# Patient Record
Sex: Male | Born: 1990 | Race: Black or African American | Hispanic: No | Marital: Single | Smoking: Never smoker
Health system: Southern US, Community
[De-identification: ages and names within clinical notes are randomized; demographics above are authoritative.]

---

## 2013-11-07 ENCOUNTER — Emergency Department (HOSPITAL_COMMUNITY): Payer: BC Managed Care – PPO

## 2013-11-07 ENCOUNTER — Emergency Department (HOSPITAL_COMMUNITY)
Admission: EM | Admit: 2013-11-07 | Discharge: 2013-11-07 | Disposition: A | Discharge status: Home / Self Care | Payer: BC Managed Care – PPO | Attending: Emergency Medicine | Admitting: Emergency Medicine

## 2013-11-07 ENCOUNTER — Encounter (HOSPITAL_COMMUNITY): Payer: Self-pay | Admitting: Emergency Medicine

## 2013-11-07 DIAGNOSIS — J029 Acute pharyngitis, unspecified: Secondary | ICD-10-CM | POA: Insufficient documentation

## 2013-11-07 DIAGNOSIS — R110 Nausea: Secondary | ICD-10-CM | POA: Insufficient documentation

## 2013-11-07 DIAGNOSIS — R109 Unspecified abdominal pain: Secondary | ICD-10-CM | POA: Insufficient documentation

## 2013-11-07 DIAGNOSIS — R509 Fever, unspecified: Secondary | ICD-10-CM

## 2013-11-07 LAB — CBC WITH DIFFERENTIAL/PLATELET
Eosinophils Absolute: 0.1 10*3/uL (ref 0.0–0.7)
HCT: 42.2 % (ref 39.0–52.0)
Hemoglobin: 14.5 g/dL (ref 13.0–17.0)
Lymphocytes Relative: 17 % (ref 12–46)
Lymphs Abs: 1 10*3/uL (ref 0.7–4.0)
MCV: 87.7 fL (ref 78.0–100.0)
Monocytes Absolute: 0.7 10*3/uL (ref 0.1–1.0)
Monocytes Relative: 12 % (ref 3–12)
Neutro Abs: 4.1 10*3/uL (ref 1.7–7.7)
Neutrophils Relative %: 70 % (ref 43–77)
RBC: 4.81 MIL/uL (ref 4.22–5.81)
WBC: 5.9 10*3/uL (ref 4.0–10.5)

## 2013-11-07 LAB — RAPID STREP SCREEN (MED CTR MEBANE ONLY): Streptococcus, Group A Screen (Direct): NEGATIVE

## 2013-11-07 LAB — COMPREHENSIVE METABOLIC PANEL
ALT: 47 U/L (ref 0–53)
Albumin: 4.4 g/dL (ref 3.5–5.2)
Alkaline Phosphatase: 60 U/L (ref 39–117)
BUN: 13 mg/dL (ref 6–23)
CO2: 28 mEq/L (ref 19–32)
Chloride: 100 mEq/L (ref 96–112)
Creatinine, Ser: 1.33 mg/dL (ref 0.50–1.35)
GFR calc non Af Amer: 75 mL/min — ABNORMAL LOW (ref >90)
Potassium: 3.9 mEq/L (ref 3.5–5.1)
Total Bilirubin: 0.7 mg/dL (ref 0.3–1.2)

## 2013-11-07 LAB — URINALYSIS, ROUTINE W REFLEX MICROSCOPIC
Bilirubin Urine: NEGATIVE
Glucose, UA: NEGATIVE mg/dL
Ketones, ur: NEGATIVE mg/dL
Leukocytes, UA: NEGATIVE
Nitrite: NEGATIVE
Protein, ur: 30 mg/dL — AB
Specific Gravity, Urine: 1.038 — ABNORMAL HIGH (ref 1.005–1.030)
Urobilinogen, UA: 1 mg/dL (ref 0.0–1.0)
pH: 6.5 (ref 5.0–8.0)

## 2013-11-07 LAB — URINE MICROSCOPIC-ADD ON

## 2013-11-07 MED ORDER — ONDANSETRON HCL 4 MG/2ML IJ SOLN
4.0000 mg | Freq: Once | INTRAMUSCULAR | Status: AC
  Administered 2013-11-07: 4 mg via INTRAVENOUS
  Filled 2013-11-07: qty 2

## 2013-11-07 MED ORDER — PENICILLIN V POTASSIUM 500 MG PO TABS: 500.0000 mg | ORAL_TABLET | Freq: Three times a day (TID) | ORAL | Status: DC

## 2013-11-07 MED ORDER — TRAMADOL HCL 50 MG PO TABS: 50.0000 mg | ORAL_TABLET | Freq: Four times a day (QID) | ORAL | Status: DC | PRN

## 2013-11-07 MED ORDER — MORPHINE SULFATE 4 MG/ML IJ SOLN
4.0000 mg | Freq: Once | INTRAMUSCULAR | Status: AC
  Administered 2013-11-07: 4 mg via INTRAVENOUS
  Filled 2013-11-07: qty 1

## 2013-11-07 MED ORDER — SODIUM CHLORIDE 0.9 % IV BOLUS (SEPSIS)
1000.0000 mL | Freq: Once | INTRAVENOUS | Status: AC
  Administered 2013-11-07: 1000 mL via INTRAVENOUS

## 2013-11-07 MED ORDER — IOHEXOL 300 MG/ML  SOLN: 50.0000 mL | Freq: Once | INTRAMUSCULAR | Status: DC | PRN

## 2013-11-07 NOTE — ED Notes (Signed)
Pt taken ice water and encouraged to drink slowly

## 2013-11-07 NOTE — ED Notes (Signed)
Pt states that he started having nausea, sore throat, and cough yesterday. Started having RLQ pain 1 hour ago. Fever 102.1 at [facility]. Given 975mg  tylenol.

## 2013-11-07 NOTE — ED Notes (Signed)
Pt did well with fluid challenge. 

## 2013-11-07 NOTE — ED Provider Notes (Signed)
CSN: [id number]     Arrival date & time 11/07/13  1714 History   First MD Initiated Contact with Patient 11/07/13 2007     Chief Complaint  Patient presents with  . Abdominal Pain  . Sore Throat   (Consider location/radiation/quality/duration/timing/severity/associated sxs/prior Treatment) Patient is a 22 y.o. male presenting with abdominal pain, pharyngitis, and fever. The history is provided by the patient.  Abdominal Pain Associated symptoms: fever and nausea   Associated symptoms: no chest pain, no cough, no dysuria, no sore throat and no vomiting   Sore Throat Associated symptoms include abdominal pain, a fever and nausea. Pertinent negatives include no chest pain, congestion, coughing, rash, sore throat or vomiting.  Fever Max temp prior to arrival:  102.1 Temp source:  Oral Severity:  Moderate Onset quality:  Sudden Duration:  2 days Timing:  Constant Progression:  Worsening Chronicity:  New Relieved by:  Acetaminophen Worsened by:  Movement Associated symptoms: nausea   Associated symptoms: no chest pain, no confusion, no congestion, no cough, no dysuria, no ear pain, no rash, no sore throat and no vomiting     Jimmy Ortiz is a 22 y.o.male without any significant PMH presents to the ER with complaints of  Nausea, sore throat, cough that started yesterday. [facility] to get evaluated and when the doctor pushed on his abdomen his stomach began to hurt. It did not hurt before and it no longer hurts, it only hurt for a little while when he pushed on it. He denies hx of abdominal surgery previously. His fever has reached 102.1.      History reviewed. No pertinent past medical history. History reviewed. No pertinent past surgical history. History reviewed. No pertinent family history. History  Substance Use Topics  . Smoking status: Never Smoker   . Smokeless tobacco: Not on file  . Alcohol Use: Yes    Review of Systems  Constitutional: Positive for  fever.  HENT: Negative for congestion, ear pain and sore throat.   Respiratory: Negative for cough.   Cardiovascular: Negative for chest pain.  Gastrointestinal: Positive for nausea and abdominal pain. Negative for vomiting.  Genitourinary: Negative for dysuria.  Skin: Negative for rash.  Psychiatric/Behavioral: Negative for confusion.    Allergies  Review of patient's allergies indicates no known allergies.  Home Medications   Current Outpatient Rx  Name  Route  Sig  Dispense  Refill  . DM-Doxylamine-Acetaminophen (NYQUIL COLD & FLU PO)   Oral   Take 30 mLs by mouth 2 (two) times daily as needed (flu-like symptoms).         . penicillin v potassium (VEETID) 500 MG tablet   Oral   Take 1 tablet (500 mg total) by mouth 3 (three) times daily.   30 tablet   0   . traMADol (ULTRAM) 50 MG tablet   Oral   Take 1 tablet (50 mg total) by mouth every 6 (six) hours as needed.   15 tablet   0    BP 146/83  Pulse 88  Temp(Src) 98 F (36.7 C) (Oral)  Resp 17  SpO2 100% Physical Exam  Nursing note and vitals reviewed. Constitutional: He appears well-developed and well-nourished. No distress.  HENT:  Head: Normocephalic and atraumatic.  Eyes: Pupils are equal, round, and reactive to light.  Neck: Normal range of motion. Neck supple.  Cardiovascular: Normal rate and regular rhythm.   Pulmonary/Chest: Effort normal.  Abdominal: Soft. Bowel sounds are normal. He exhibits no distension, no  fluid wave and no ascites. There is no tenderness.    Neurological: He is alert.  Skin: Skin is warm and dry.    ED Course  Procedures (including critical care time) Labs Review Labs Reviewed  COMPREHENSIVE METABOLIC PANEL - Abnormal; Notable for the following:    Glucose, Bld 103 (*)    AST 40 (*)    GFR calc non Af Amer 75 (*)    GFR calc Af Amer 87 (*)    All other components within normal limits  URINALYSIS, ROUTINE W REFLEX MICROSCOPIC - Abnormal; Notable for the following:     Color, Urine AMBER (*)    Specific Gravity, Urine 1.038 (*)    Protein, ur 30 (*)    All other components within normal limits  URINE MICROSCOPIC-ADD ON - Abnormal; Notable for the following:    Bacteria, UA FEW (*)    All other components within normal limits  RAPID STREP SCREEN  URINE CULTURE  CULTURE, GROUP A STREP  CBC WITH DIFFERENTIAL   Imaging Review Dg Abd 2 Views  11/07/2013   CLINICAL DATA:  Diffuse abdominal pain  EXAM: ABDOMEN - 2 VIEW  COMPARISON:  None  FINDINGS: Mild retrocardiac opacity. No free intraperitoneal air. Nonspecific, nonobstructive bowel gas pattern. No radiopaque calculi. Mild curvature of the lumbar spine. No acute osseous finding.  IMPRESSION: Nonspecific, nonobstructive bowel gas pattern.  Mild retrocardiac opacity; atelectasis (favored) versus infiltrate.   Electronically Signed   By: Jearld Lesch M.D.   On: 11/07/2013 21:28    EKG Interpretation   None       MDM   1. Fever   2. Sore throat      The patient reports that his stomach did hurt when the school physician pushed on it. However, it does not hurt now. With a thorough abdominal exam and in the context of no vomiting, without elevated white count and a cause of fever (sore throat) I do not believe that CT scan of the abdomen is necessary at this time.  10:01 pm- spoke with family, mom is a Engineer, civil (consulting), feels comfortable not doing CT scan because he is eating and drinking fine with no white count. She says he sounds much better than he did earlier today.  10:58 pm- patient doing well, ate and drank with no problems. ] RX: Penicillin and tramadol.  22 y.o.Geni Bers evaluation in the Emergency Department is complete. It has been determined that no acute conditions requiring further emergency intervention are present at this time. The patient/guardian have been advised of the diagnosis and plan. We have discussed signs and symptoms that warrant return to the ED, such as changes or  worsening in symptoms.  Vital signs are stable at discharge. Filed Vitals:   11/07/13 2254  BP: 146/83  Pulse: 88  Temp: 98 F (36.7 C)  Resp: 17    Patient/guardian has voiced understanding and agreed to follow-up with the PCP or specialist.    Jimmy Matas, PA-C 11/07/13 2259

## 2013-11-07 NOTE — Progress Notes (Signed)
EDCM spoke to patient at bedside.  Patient reports he is from [place], [place].  His pcp in [place] is Dr. Mickle Plumb.  Patient is currently going to school here in [facility].  [facility] instructed patient to go his insurance company website to help him find a physician who is close to him and within network.  Patient verbalized understanding and thankful for information.

## 2013-11-08 NOTE — ED Provider Notes (Signed)
Medical screening examination/treatment/procedure(s) were performed by non-physician practitioner and as supervising physician I was immediately available for consultation/collaboration.  EKG Interpretation   None        Shon Baton, MD 11/08/13 1635

## 2013-11-09 LAB — URINE CULTURE: Culture: NO GROWTH

## 2013-11-09 LAB — CULTURE, GROUP A STREP

## 2017-11-18 ENCOUNTER — Emergency Department (HOSPITAL_BASED_OUTPATIENT_CLINIC_OR_DEPARTMENT_OTHER)
Admission: EM | Admit: 2017-11-18 | Discharge: 2017-11-18 | Disposition: A | Discharge status: Home / Self Care | Payer: BLUE CROSS/BLUE SHIELD | Attending: Emergency Medicine | Admitting: Emergency Medicine

## 2017-11-18 ENCOUNTER — Encounter (HOSPITAL_BASED_OUTPATIENT_CLINIC_OR_DEPARTMENT_OTHER): Payer: Self-pay

## 2017-11-18 ENCOUNTER — Other Ambulatory Visit: Payer: Self-pay

## 2017-11-18 ENCOUNTER — Emergency Department (HOSPITAL_BASED_OUTPATIENT_CLINIC_OR_DEPARTMENT_OTHER): Payer: BLUE CROSS/BLUE SHIELD

## 2017-11-18 DIAGNOSIS — Y998 Other external cause status: Secondary | ICD-10-CM | POA: Insufficient documentation

## 2017-11-18 DIAGNOSIS — S6991XA Unspecified injury of right wrist, hand and finger(s), initial encounter: Secondary | ICD-10-CM | POA: Diagnosis present

## 2017-11-18 DIAGNOSIS — S63614A Unspecified sprain of right ring finger, initial encounter: Secondary | ICD-10-CM

## 2017-11-18 DIAGNOSIS — Y939 Activity, unspecified: Secondary | ICD-10-CM | POA: Diagnosis not present

## 2017-11-18 DIAGNOSIS — W01198A Fall on same level from slipping, tripping and stumbling with subsequent striking against other object, initial encounter: Secondary | ICD-10-CM | POA: Diagnosis not present

## 2017-11-18 DIAGNOSIS — Y929 Unspecified place or not applicable: Secondary | ICD-10-CM | POA: Insufficient documentation

## 2017-11-18 NOTE — ED Provider Notes (Signed)
[facility] EMERGENCY DEPARTMENT Provider Note   CSN: [id number] Arrival date & time: 11/18/17  1827     History   Chief Complaint Chief Complaint  Patient presents with  . Hand Injury    HPI Jimmy Ortiz is a 26 y.o. male.  HPI  26 y.o. male, presents to the Emergency Department today due to right ring finger sprain. Mechanical fall resulting in trauma to finger. No head trauma or LOC. ROM intact of finger. No numbness. No swelling. This occurred over the weekend. Mild pain with ROM, but intact. Wanted to make sure no fracture. Rates pain 2/10. Dull ache. No other symptoms noted   History reviewed. No pertinent past medical history.  There are no active problems to display for this patient.   History reviewed. No pertinent surgical history.     Home Medications    Prior to Admission medications   Not on File    Family History No family history on file.  Social History Social History   Tobacco Use  . Smoking status: Never Smoker  . Smokeless tobacco: Never Used  Substance Use Topics  . Alcohol use: Yes    Comment: occ  . Drug use: No     Allergies   Patient has no known allergies.   Review of Systems Review of Systems ROS reviewed and all are negative for acute change except as noted in the HPI.  Physical Exam Updated Vital Signs BP 137/87 (BP Location: Left Arm)   Pulse 64   Temp 98.1 F (36.7 C) (Oral)   Resp 16   Ht 6\' 1"  (1.854 m)   Wt 122.5 kg (270 lb)   SpO2 98%   BMI 35.62 kg/m   Physical Exam  Constitutional: He is oriented to person, place, and time. Vital signs are normal. He appears well-developed and well-nourished.  HENT:  Head: Normocephalic.  Right Ear: Hearing normal.  Left Ear: Hearing normal.  Eyes: Conjunctivae and EOM are normal. Pupils are equal, round, and reactive to light.  Cardiovascular: Normal rate and regular rhythm.  Pulmonary/Chest: Effort normal.  Musculoskeletal:  Right index finger ROM  intact. No swelling. Cap refill <sec. TTP along interphalangeal space between PIP and DIP  Neurological: He is alert and oriented to person, place, and time.  Skin: Skin is warm and dry.  Psychiatric: He has a normal mood and affect. His speech is normal and behavior is normal. Thought content normal.  Nursing note and vitals reviewed.    ED Treatments / Results  Labs (all labs ordered are listed, but only abnormal results are displayed) Labs Reviewed - No data to display  EKG  EKG Interpretation None       Radiology Dg Hand Complete Right  Result Date: 11/18/2017 CLINICAL DATA:  Injury to right hand with pain overlying second through fingers. Initial encounter. EXAM: RIGHT HAND - COMPLETE 3+ VIEW COMPARISON:  None. FINDINGS: There is no evidence of fracture or dislocation. There is no evidence of arthropathy or other focal bone abnormality. Soft tissues are unremarkable. IMPRESSION: Negative. Electronically Signed   By: Irish LackGlenn  Yamagata M.D.   On: 11/18/2017 19:08    Procedures Procedures (including critical care time)  Medications Ordered in ED Medications - No data to display   Initial Impression / Assessment and Plan / ED Course  I have reviewed the triage vital signs and the nursing notes.  Pertinent labs & imaging results that were available during my care of the patient were reviewed by me  and considered in my medical decision making (see chart for details).  Final Clinical Impressions(s) / ED Diagnoses   {I have reviewed and evaluated the relevant imaging studies.  {I have reviewed the relevant previous healthcare records.  {I obtained HPI from historian.   ED Course:  Assessment: Patient X-Ray negative for obvious fracture or dislocation. Likely strain. Pt advised to follow up with PCP. Patient given brace while in ED, conservative therapy recommended and discussed. Patient will be discharged home & is agreeable with above plan. Returns precautions discussed. Pt  appears safe for discharge  Disposition/Plan:  DC Home Additional Verbal discharge instructions given and discussed with patient.  Pt Instructed to f/u with PCP in the next week for evaluation and treatment of symptoms. Return precautions given Pt acknowledges and agrees with plan  Supervising Physician Arby BarrettePfeiffer, Marcy, MD  Final diagnoses:  Sprain of right ring finger, unspecified site of finger, initial encounter    ED Discharge Orders    None       Audry PiliMohr, Dody Smartt, Cordelia Poche-C 11/18/17 2035    Arby BarrettePfeiffer, Marcy, MD 11/20/17 [phone]

## 2017-11-18 NOTE — Discharge Instructions (Signed)
Please read and follow all provided instructions.  Your diagnoses today include:  1. Sprain of right ring finger, unspecified site of finger, initial encounter     Tests performed today include: Vital signs. See below for your results today.   Medications prescribed:  Take as prescribed   Home care instructions:  Follow any educational materials contained in this packet.  Follow-up instructions: Please follow-up with your primary care provider for further evaluation of symptoms and treatment   Return instructions:  Please return to the Emergency Department if you do not get better, if you get worse, or new symptoms OR  - Fever (temperature greater than 101.54F)  - Bleeding that does not stop with holding pressure to the area    -Severe pain (please note that you may be more sore the day after your accident)  - Chest Pain  - Difficulty breathing  - Severe nausea or vomiting  - Inability to tolerate food and liquids  - Passing out  - Skin becoming red around your wounds  - Change in mental status (confusion or lethargy)  - New numbness or weakness    Please return if you have any other emergent concerns.  Additional Information:  Your vital signs today were: BP 137/87 (BP Location: Left Arm)    Pulse 64    Temp 98.1 F (36.7 C) (Oral)    Resp 16    Ht 6\' 1"  (1.854 m)    Wt 122.5 kg (270 lb)    SpO2 98%    BMI 35.62 kg/m  If your blood pressure (BP) was elevated above 135/85 this visit, please have this repeated by your doctor within one month. ---------------

## 2017-11-18 NOTE — ED Triage Notes (Signed)
Pt states he tripped/fell into wall 12/8-pain to right hand-NAD-steady gait

## 2017-11-18 NOTE — ED Notes (Signed)
PMS intact before and after. Pt tolerated well. All questions answered. 

## 2018-12-25 IMAGING — CR DG HAND COMPLETE 3+V*R*
4 series · 4 of 4 positions shown · non-contrast
Comparison: None.

CLINICAL DATA: Injury to right hand with pain overlying second
through fingers. Initial encounter.

EXAM:
RIGHT HAND - COMPLETE 3+ VIEW

[x hand pa right]
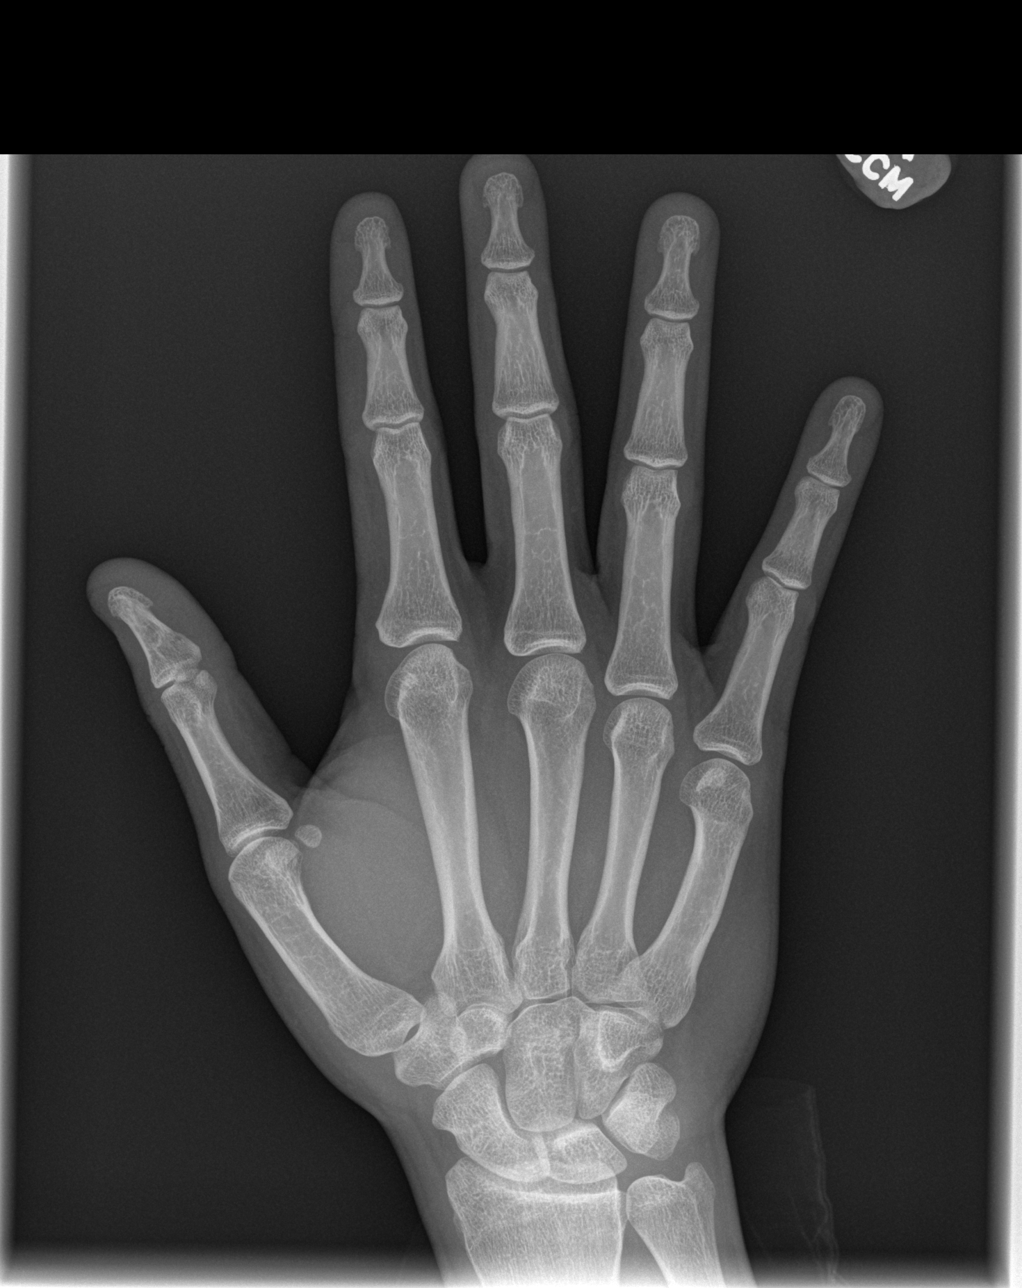

[x hand oblique right]
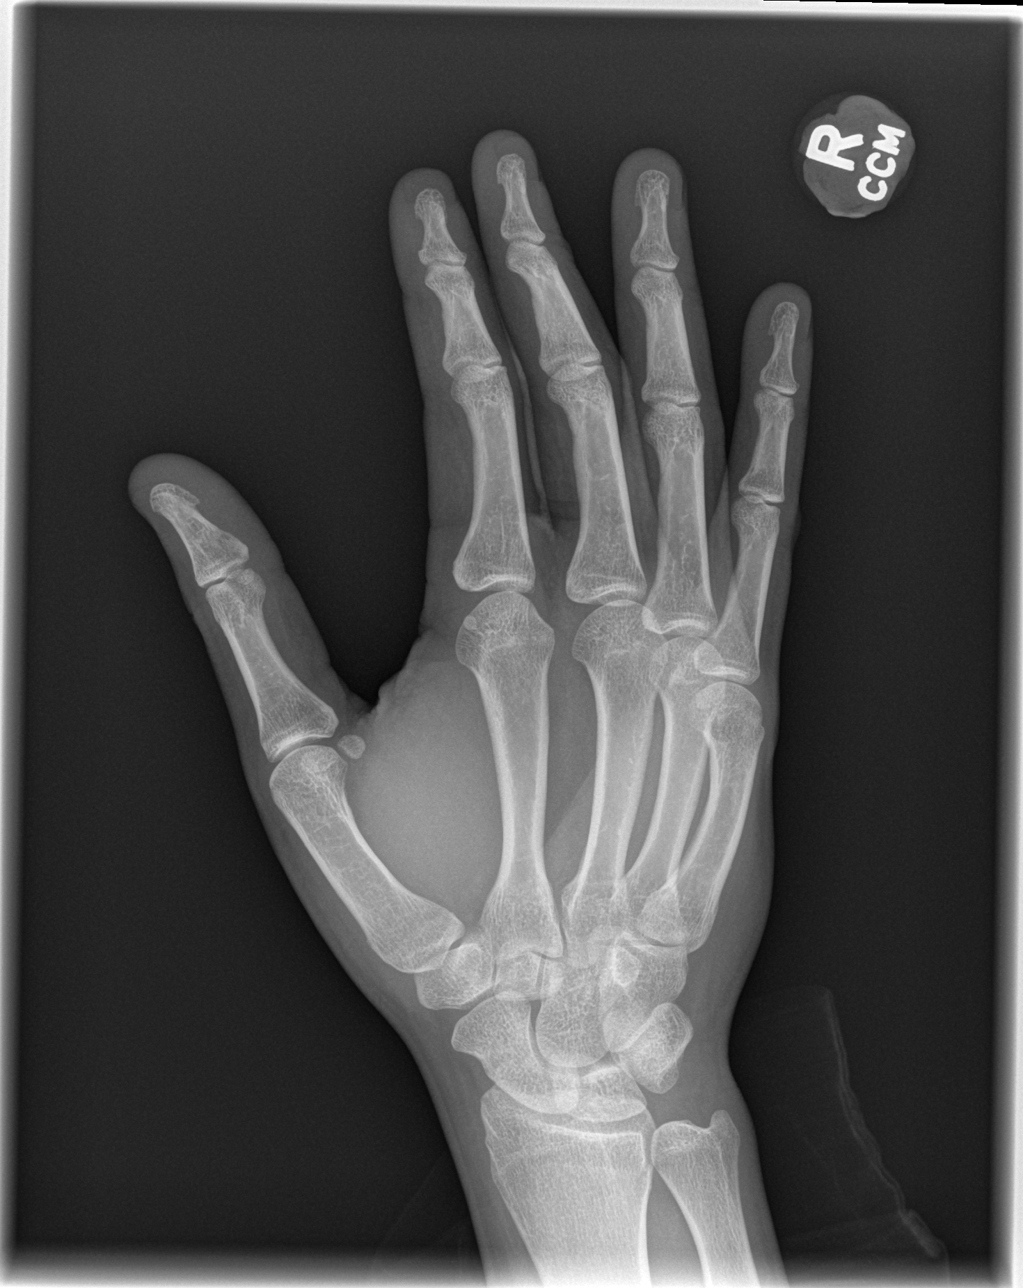

[x hand lat right (1 of 2)]
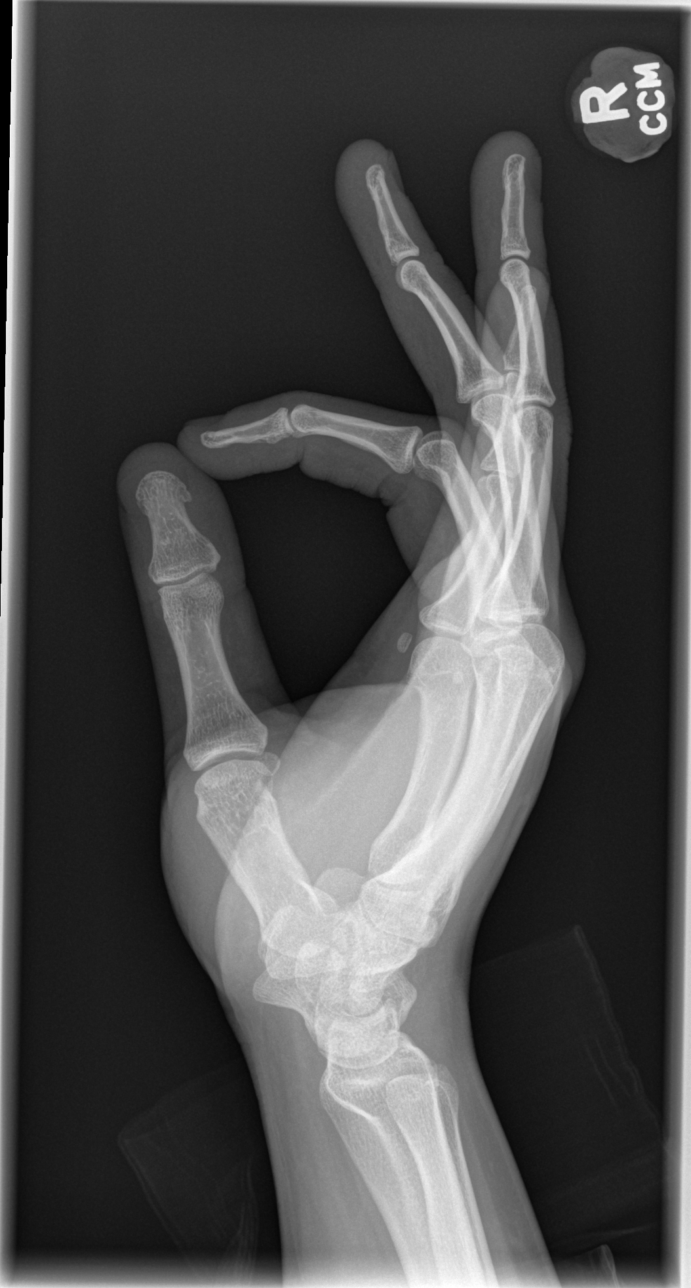

[x hand lat right (2 of 2)]
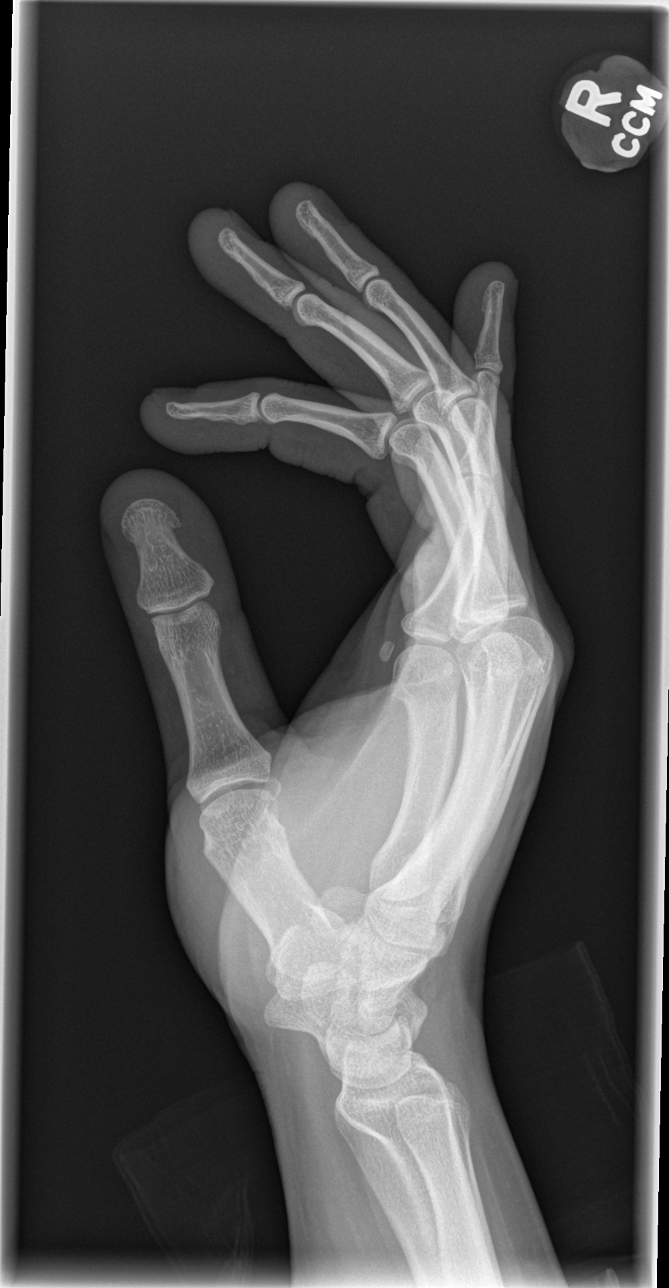

[4 of 4 positions shown; findings below may reference images not displayed]

FINDINGS: There is no evidence of fracture or dislocation. There is no
evidence of arthropathy or other focal bone abnormality. Soft
tissues are unremarkable.
IMPRESSION: Negative.

## 2024-12-22 ENCOUNTER — Ambulatory Visit: Admitting: Podiatry

## 2024-12-22 ENCOUNTER — Encounter: Payer: Self-pay | Admitting: Podiatry

## 2024-12-22 DIAGNOSIS — B351 Tinea unguium: Secondary | ICD-10-CM

## 2024-12-22 DIAGNOSIS — B353 Tinea pedis: Secondary | ICD-10-CM

## 2024-12-22 MED ORDER — KETOCONAZOLE 2 % EX CREA: 1.0000 | TOPICAL_CREAM | Freq: Every day | CUTANEOUS | 2 refills | Status: AC

## 2024-12-22 NOTE — Addendum Note (Signed)
 Addended by: Kipling Graser J on: 12/22/2024 04:35 PM   Modules accepted: Orders

## 2024-12-22 NOTE — Progress Notes (Signed)
"  °  Subjective:  Patient ID: Jimmy Ortiz, male    DOB: 05/27/1991,   MRN: [id number]  Chief Complaint  Patient presents with   Callouses    I am here mainly about the skin on my right big toe.  It has some cracks and it's kind of hardening around the toenail.    34 y.o. male presents for concern of cracking and hardening around his toenail on his right great and second toenail.  He relates this has been ongoing for couple years.  He relates some itching in the area as well.  Denies any pain.  He has been trying moisturizing lotions as well as some over-the-counter antifungals and not getting any improvement.  Also relates some changes in the nails in this area as well.. Denies any other pedal complaints. Denies n/v/f/c.   No past medical history on file.  Objective:  Physical Exam: Vascular: DP/PT pulses 2/4 bilateral. CFT <3 seconds. Normal hair growth on digits. No edema.  Skin. No lacerations or abrasions bilateral feet.  Nails 1 and 2 on the right slightly thickened with subungual debris distally.  Distal skin with xerosis present and cracking mild erythema and scaling noted. Musculoskeletal: MMT 5/5 bilateral lower extremities in DF, PF, Inversion and Eversion. Deceased ROM in DF of ankle joint.  Neurological: Sensation intact to light touch.   Assessment:   1. Tinea pedis of right foot   2. Onychomycosis      Plan:  Patient was evaluated and treated and all questions answered. -Examined patient -Discussed treatment options for painful dystrophic nails and tinea pedis -Clinical picture and Fungal culture was obtained by removing a portion of the hard nail itself from each of the involved toenails using a sterile nail nipper and sent to [facility] lab. Patient tolerated the biopsy procedure well without discomfort or need for anesthesia.  -Ketoconazole  sent to the pharmacy to be used daily. -Discussed fungal nail treatment options including oral, topical, and laser treatments.   -Patient to return in 4 weeks for follow up evaluation and discussion of fungal culture results or sooner if symptoms worsen.   Asberry Failing, DPM    "

## 2025-01-19 ENCOUNTER — Ambulatory Visit: Admitting: Podiatry
